# Patient Record
Sex: Female | Born: 1945 | Race: White | Hispanic: No | Marital: Married | Smoking: Never smoker
Health system: Southern US, Academic
[De-identification: ages and names within clinical notes are randomized; demographics above are authoritative.]

## PROBLEM LIST (undated history)

## (undated) DIAGNOSIS — I10 Essential (primary) hypertension: Secondary | ICD-10-CM

## (undated) DIAGNOSIS — E039 Hypothyroidism, unspecified: Secondary | ICD-10-CM

## (undated) DIAGNOSIS — K219 Gastro-esophageal reflux disease without esophagitis: Secondary | ICD-10-CM

---

## 1992-10-06 ENCOUNTER — Other Ambulatory Visit (HOSPITAL_COMMUNITY): Payer: Self-pay | Admitting: EXTERNAL

## 2021-03-13 IMAGING — MR MRI CERVICAL SPINE WITHOUT CONTRAST
4 of 5 series · 24 of 48 positions shown · IV contrast (gadolinium)
Comparison: None available.

﻿EXAM:  [id number]   MRI CERVICAL SPINE WITHOUT CONTRAST
INDICATION: Cervicalgia.
TECHNIQUE: Non-contrast multiplanar, multi sequence MRI of the cervical spine was performed without gadolinium contrast.

[Series 5: T2 · sagittal · 3.0mm · 0.75mm/px · 8 of 15 slices shown (1 of 2)]
[im 1/15]
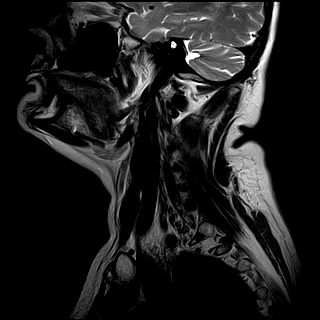
[im 3/15]
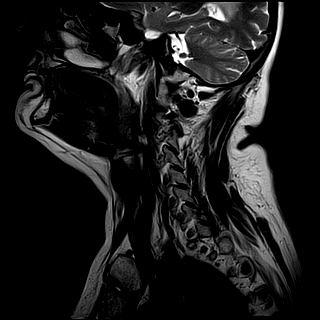
[im 5/15]
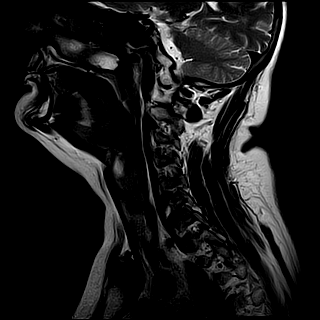
[im 7/15]
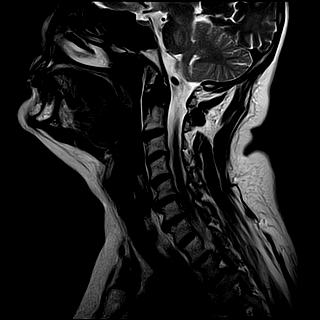
[im 9/15]
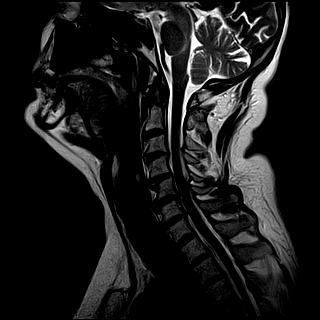
[im 11/15]
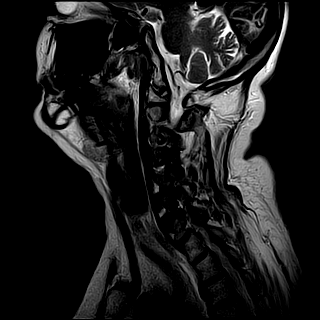
[im 13/15]
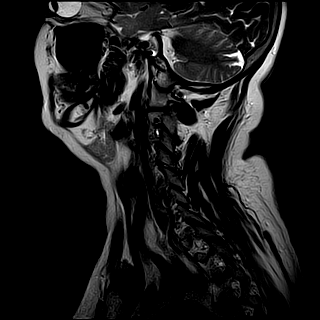
[im 15/15]
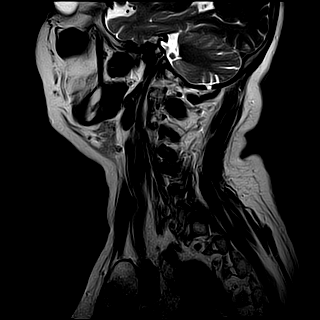

[Series 6: T1 · sagittal · 3.0mm · 0.47mm/px · 3 of 15 slices shown]
[im 2/15]
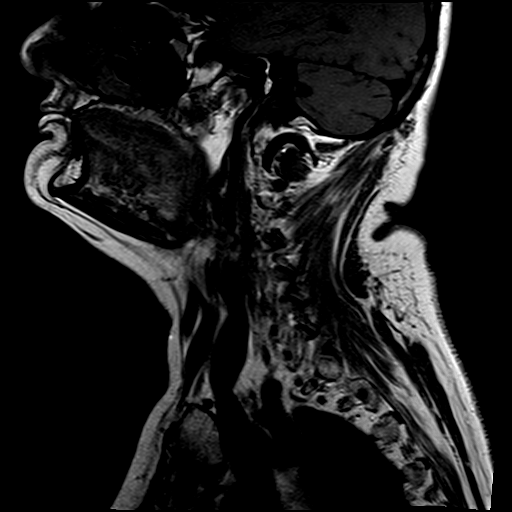
[im 8/15]
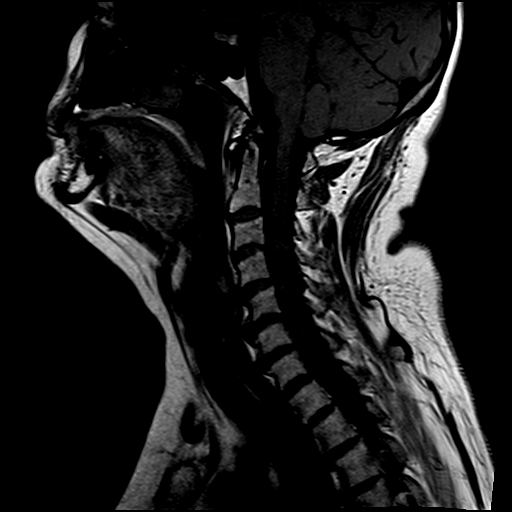
[im 13/15]
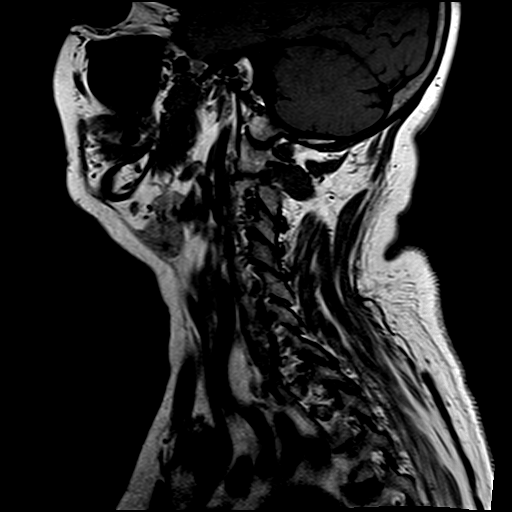

[Series 7: STIR · sagittal · 3.0mm · 0.47mm/px · 3 of 15 slices shown]
[im 2/15]
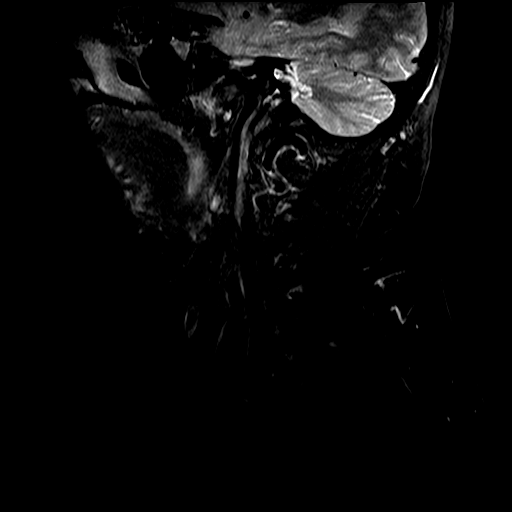
[im 8/15]
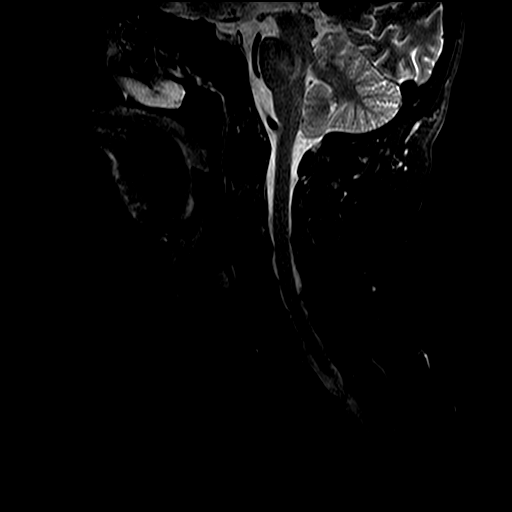
[im 13/15]
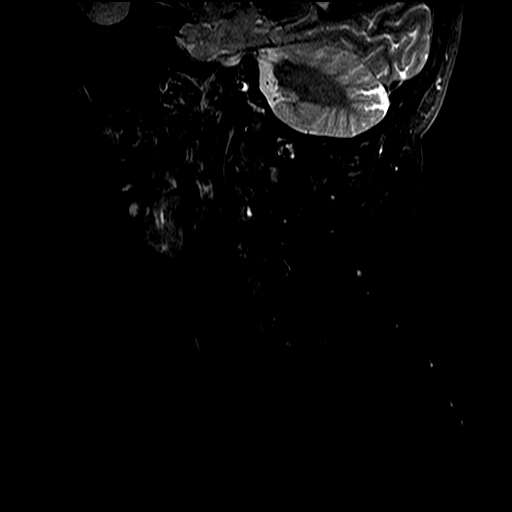

[Series 8: T2 · axial · 3.0mm · 0.39mm/px · z∈[-134,-21]mm · 10 of 18 slices shown (2 of 2)]
[im 1/18]
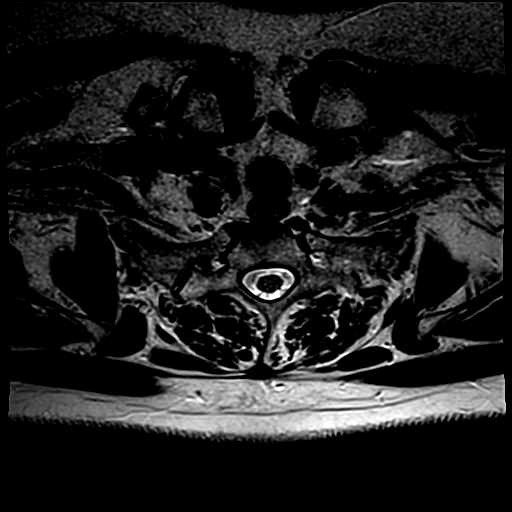
[im 2/18]
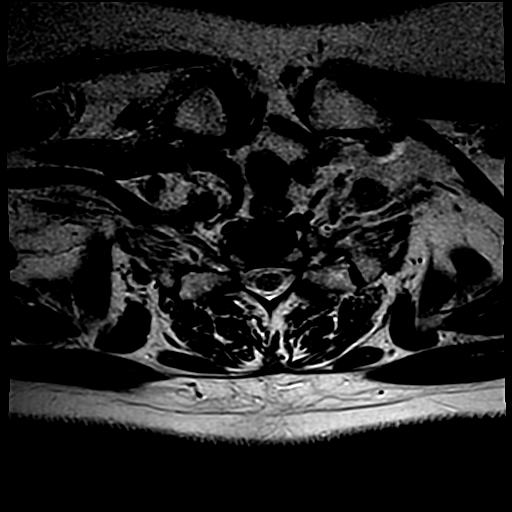
[im 4/18]
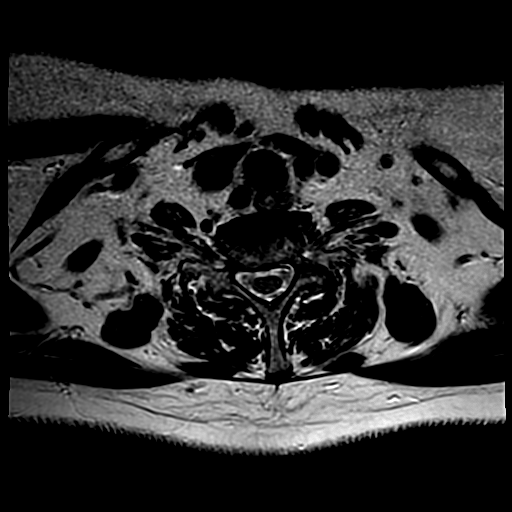
[im 6/18]
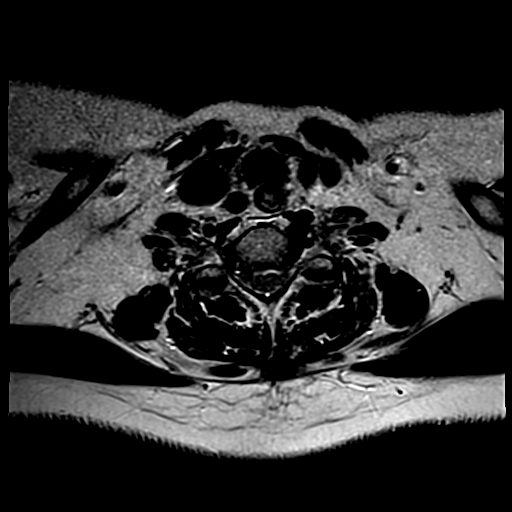
[im 7/18]
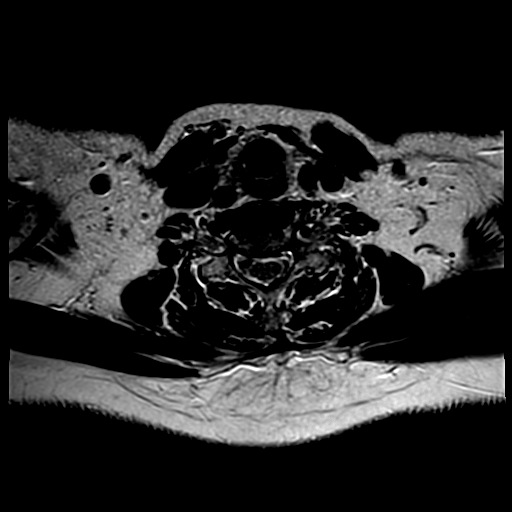
[im 9/18]
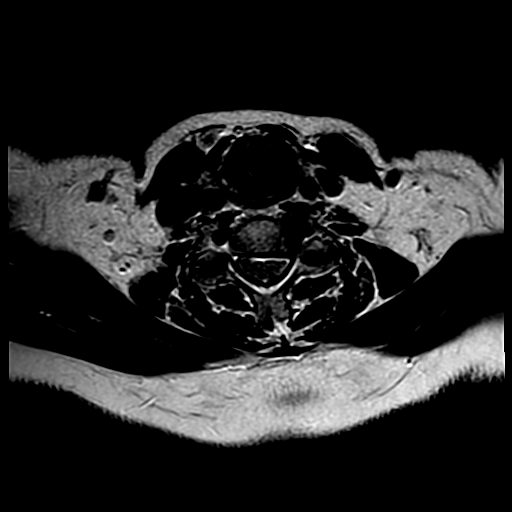
[im 11/18]
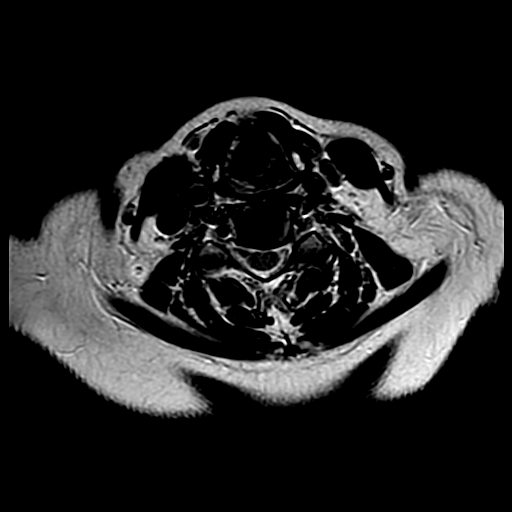
[im 12/18]
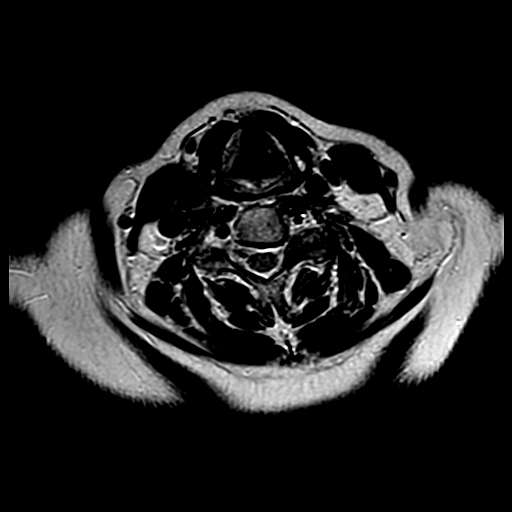
[im 14/18]
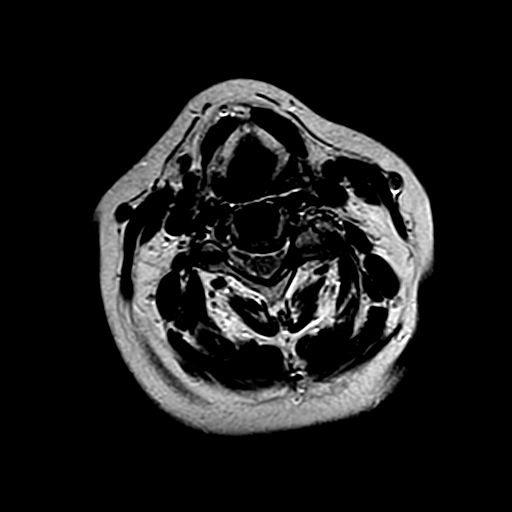
[im 16/18]
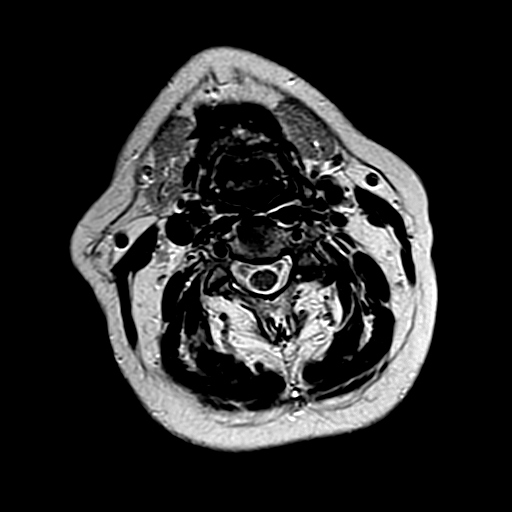

[24 of 48 positions shown; findings below may reference images not displayed]

FINDINGS: There are moderate degenerative changes at multiple levels.  There are small disc-osteophyte complexes at multiple levels with slight compression of the thecal sac and mild spinal stenosis.  

There is no fracture, malalignment, significant marrow signal alteration, disc herniation, intrinsic spinal cord abnormality, or Chiari malformation.
IMPRESSION: 1. Moderate degenerative changes with mild spinal stenosis.  

2. No fracture or disc herniation seen.  

:

## 2021-05-31 IMAGING — MR MRI LUMBAR SPINE WITHOUT AND WITH CONTRAST
9 series · 48 of 48 positions shown · IV contrast (gadavist)
Comparison: MRI lumbar spine dated 07/01/2020.

﻿EXAM:  [id number]   MRI LUMBAR SPINE WITHOUT AND WITH CONTRAST
INDICATION: Chronic lower back pain with left lower extremity radiculopathy. History of remote back surgery.
TECHNIQUE: Multiplanar multisequential MRI of the lumbar spine was performed without and with 5 mL of Gadavist.

[Series 8: T2 · sagittal · 4.0mm · 0.94mm/px · 5 of 13 slices shown (1 of 3)]
[im 1/13]
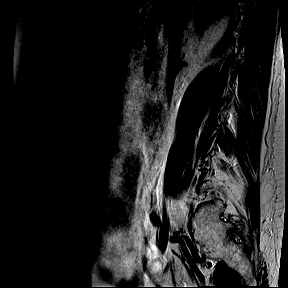
[im 4/13]
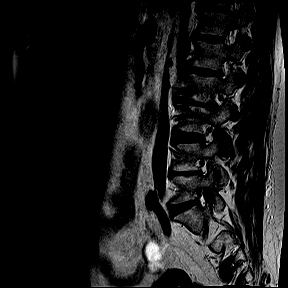
[im 7/13]
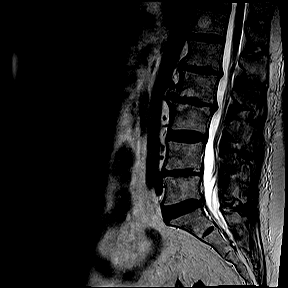
[im 10/13]
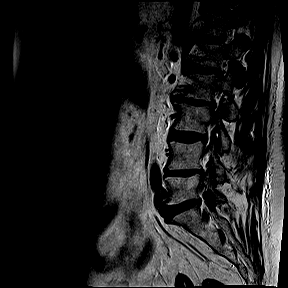
[im 13/13]
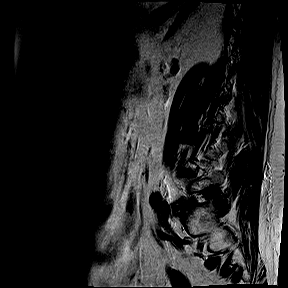

[Series 9: T1 · sagittal · 4.0mm · 0.94mm/px · 4 of 13 slices shown]
[im 1/13]
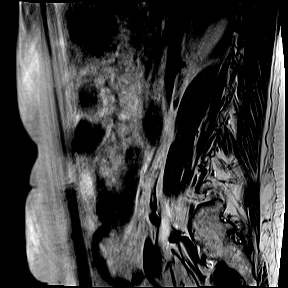
[im 5/13]
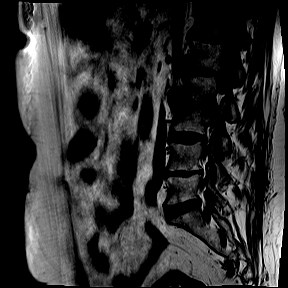
[im 9/13]
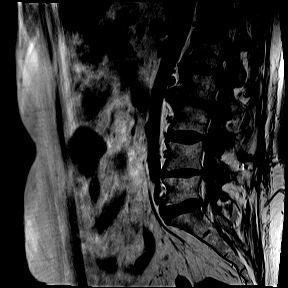
[im 13/13]
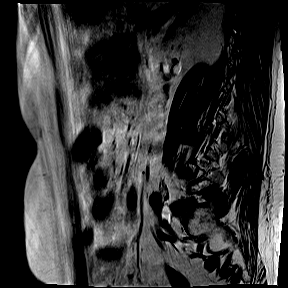

[Series 10: T1 fat-sat · sagittal · 4.0mm · 1.05mm/px · 4 of 13 slices shown (1 of 2)]
[im 1/13]
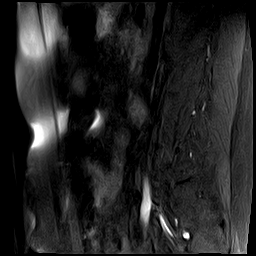
[im 5/13]
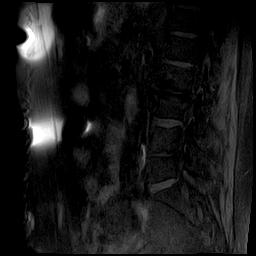
[im 9/13]
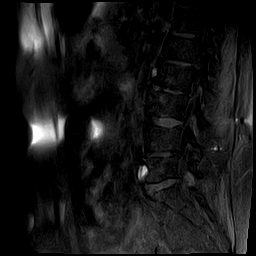
[im 13/13]
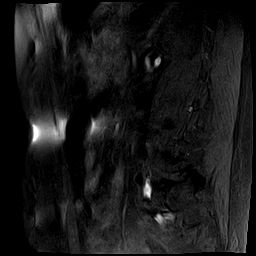

[Series 11: STIR · sagittal · 4.0mm · 1.05mm/px · 4 of 13 slices shown]
[im 1/13]
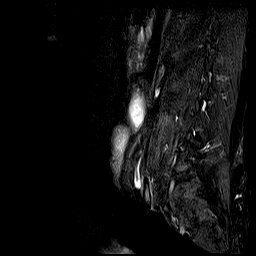
[im 5/13]
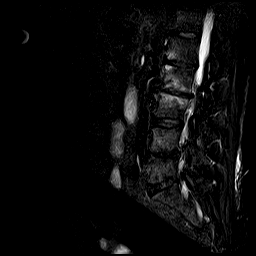
[im 9/13]
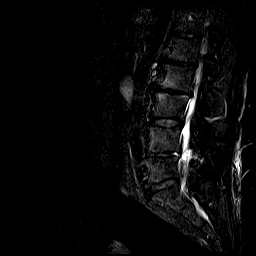
[im 13/13]
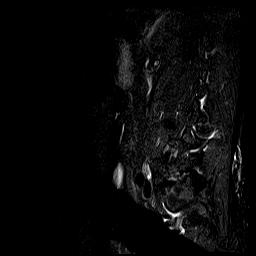

[Series 12: T2 · axial · 4.0mm · 0.47mm/px · z∈[-88,+81]mm · 7 of 23 slices shown (2 of 3)]
[im 1/23]
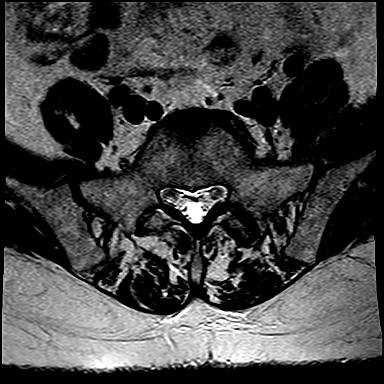
[im 4/23]
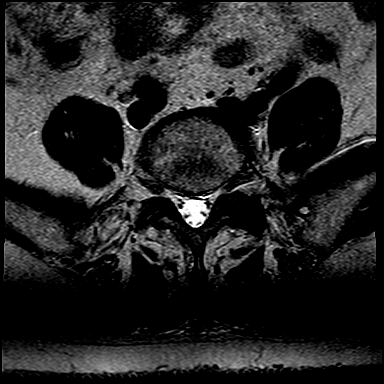
[im 8/23]
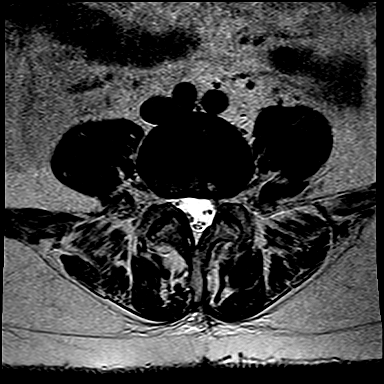
[im 12/23]
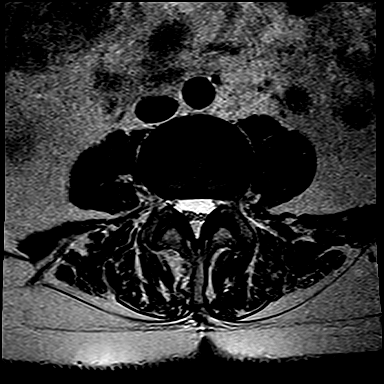
[im 15/23]
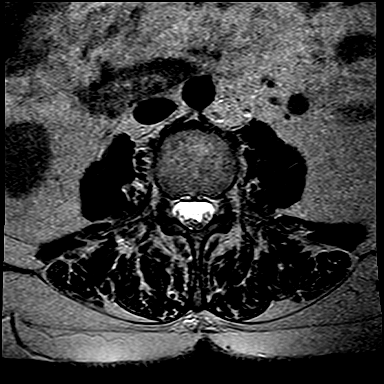
[im 19/23]
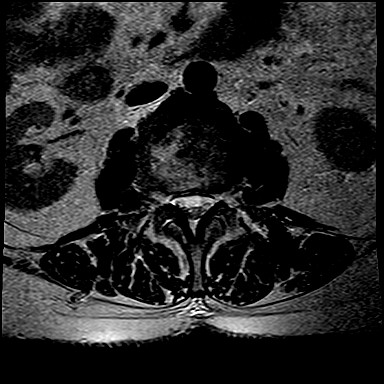
[im 23/23]
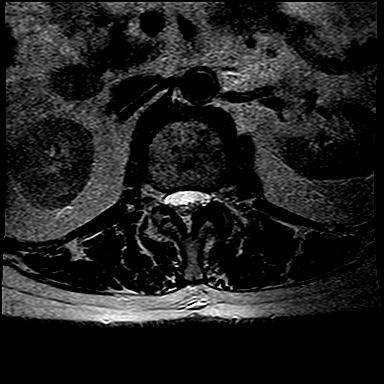

[Series 13: T1 fat-sat · axial · 4.0mm · 0.70mm/px · z∈[-88,+81]mm · 7 of 23 slices shown (2 of 2)]
[im 1/23]
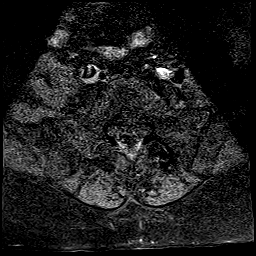
[im 4/23]
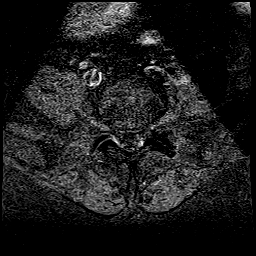
[im 8/23]
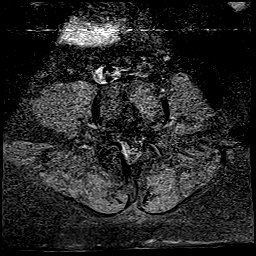
[im 12/23]
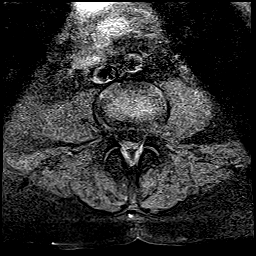
[im 15/23]
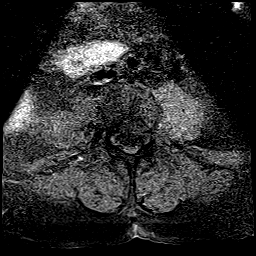
[im 19/23]
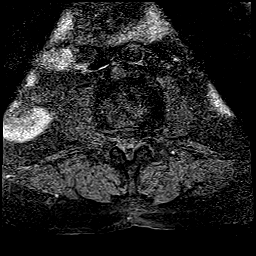
[im 23/23]
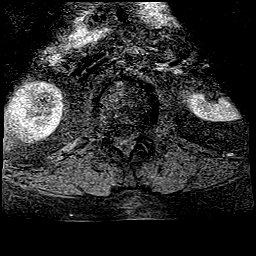

[Series 14: T1 fat-sat post-contrast · sagittal · 4.0mm · 1.05mm/px · 4 of 13 slices shown (1 of 2)]
[im 1/13]
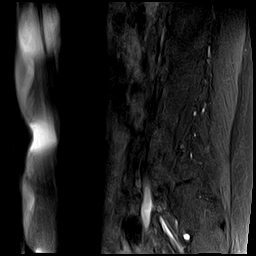
[im 5/13]
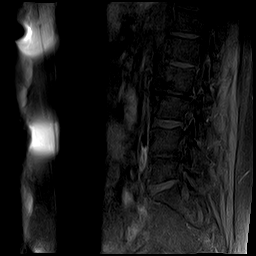
[im 9/13]
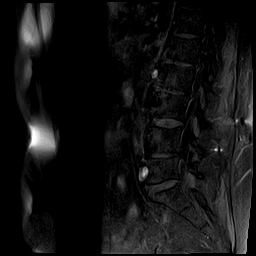
[im 13/13]
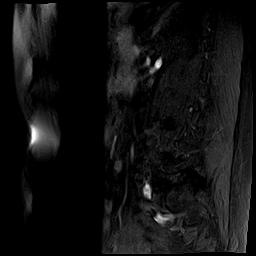

[Series 15: T1 fat-sat post-contrast · axial · 4.0mm · 0.70mm/px · z∈[-88,+81]mm · 7 of 23 slices shown (2 of 2)]
[im 1/23]
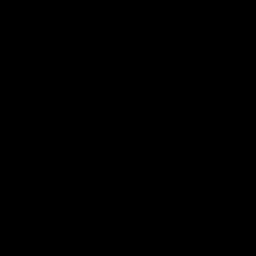
[im 4/23]
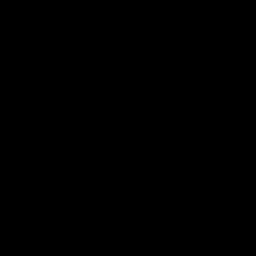
[im 8/23]
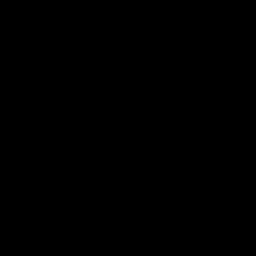
[im 12/23]
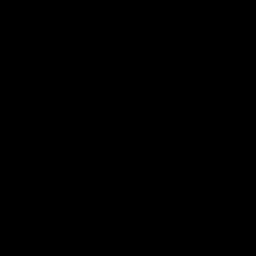
[im 15/23]
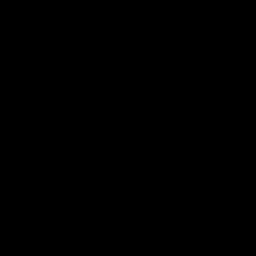
[im 19/23]
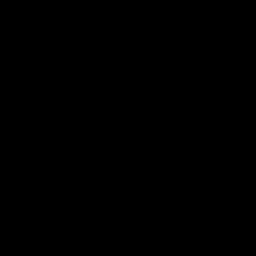
[im 23/23]
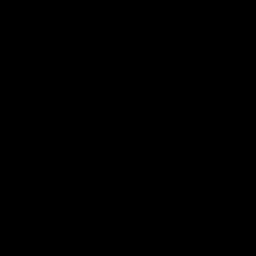

[Series 16: T2 · coronal · 4.0mm · 1.15mm/px · 6 of 20 slices shown (3 of 3)]
[im 1/20]
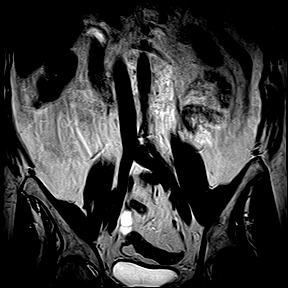
[im 4/20]
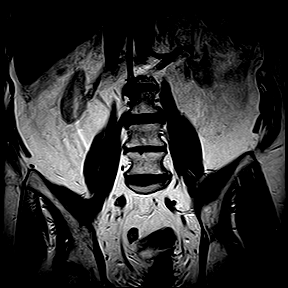
[im 8/20]
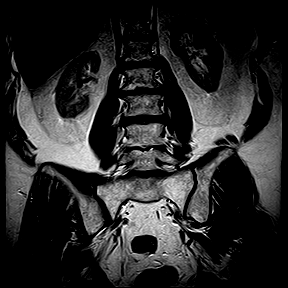
[im 12/20]
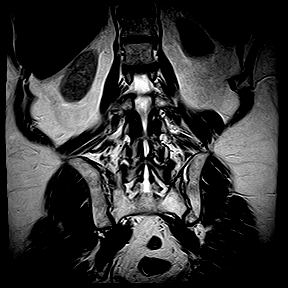
[im 16/20]
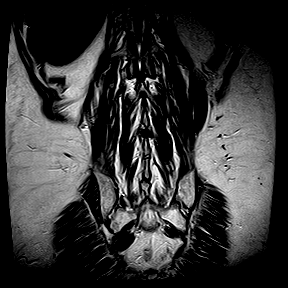
[im 20/20]
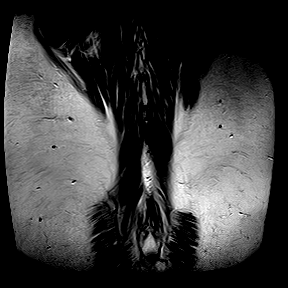

[48 of 48 positions shown; findings below may reference images not displayed]

FINDINGS: Vertebral bodies are normal in height, alignment and signal intensity. There is no acute fracture or subluxation. Distal spinal cord is normal in signal intensity and terminates normally at T12-L1 disc space level. Spinal canal is congenitally narrow.

At L1-2 level, there is moderate to marked disc desiccation. There is a small broad-based central disc bulge, mildly effacing the ventral thecal sac. There is no significant neural foraminal stenosis.

At L2-3 level, there is marked disc desiccation. Modic type 2 changes are also noted within the inferior endplate of L2 and superior endplate of L3 vertebral body. There is a small broad-based central disc bulge resulting in moderate spinal stenosis. There is mild bilateral neural foraminal stenosis from bulging annulus without nerve root impingement.

At L3-4 level, there is a minimal bulging annulus, minimally effacing the ventral thecal sac. There is mild-to-moderate left and mild right neural foraminal stenosis from facet arthropathy and bulging annulus.

At L4-5 level, there is marked disc desiccation. There is no significant disc herniation. There is mild-to-moderate bilateral neural foraminal stenosis from facet arthropathy and bulging annulus.

At L5-S1 level, there is a small broad-based central disc bulge, minimally abutting the ventral thecal sac. There is mild-to-moderate bilateral neural foraminal stenosis from facet arthropathy and bulging annulus.

Paraspinal soft tissues are unremarkable. Following intravenous contrast administration, there is no abnormal nerve root or paraspinal soft tissue enhancement.
IMPRESSION: 1. Stable moderate spinal stenosis at L2-3 level from a small central disc bulge. 

2. Multilevel neural foraminal stenosis as detailed above.

## 2022-05-31 ENCOUNTER — Inpatient Hospital Stay
Admission: RE | Admit: 2022-05-31 | Discharge: 2022-05-31 | Disposition: A | Discharge status: Home / Self Care | Payer: Medicare PPO | Source: Ambulatory Visit | Attending: Family | Admitting: Family

## 2022-05-31 ENCOUNTER — Other Ambulatory Visit: Payer: Self-pay

## 2022-05-31 ENCOUNTER — Other Ambulatory Visit (HOSPITAL_COMMUNITY): Payer: Self-pay | Admitting: Family

## 2022-05-31 DIAGNOSIS — R0602 Shortness of breath: Secondary | ICD-10-CM

## 2022-06-25 ENCOUNTER — Other Ambulatory Visit (HOSPITAL_COMMUNITY): Payer: Self-pay | Admitting: Family

## 2022-06-25 DIAGNOSIS — R0600 Dyspnea, unspecified: Secondary | ICD-10-CM

## 2022-07-09 ENCOUNTER — Other Ambulatory Visit: Payer: Self-pay

## 2022-07-09 ENCOUNTER — Inpatient Hospital Stay
Admission: RE | Admit: 2022-07-09 | Discharge: 2022-07-09 | Disposition: A | Discharge status: Home / Self Care | Payer: Medicare PPO | Source: Ambulatory Visit | Attending: Family | Admitting: Family

## 2022-07-09 DIAGNOSIS — R0600 Dyspnea, unspecified: Secondary | ICD-10-CM | POA: Insufficient documentation

## 2022-07-09 MED ORDER — SODIUM CHLORIDE 0.9 % INJECTION SOLUTION
2.0000 mL | Freq: Once | INTRAVENOUS | Status: DC | PRN
Start: 2022-07-09 — End: 2022-07-10
  Administered 2022-07-09: 5 mL via INTRAVENOUS

## 2022-07-12 ENCOUNTER — Emergency Department (HOSPITAL_COMMUNITY): Payer: Medicare PPO

## 2022-07-12 ENCOUNTER — Encounter (HOSPITAL_COMMUNITY): Payer: Self-pay

## 2022-07-12 ENCOUNTER — Other Ambulatory Visit: Payer: Self-pay

## 2022-07-12 ENCOUNTER — Emergency Department
Admission: EM | Admit: 2022-07-12 | Discharge: 2022-07-13 | Disposition: A | Discharge status: Home / Self Care | Payer: Medicare PPO | Attending: Emergency Medicine | Admitting: Emergency Medicine

## 2022-07-12 DIAGNOSIS — R9431 Abnormal electrocardiogram [ECG] [EKG]: Secondary | ICD-10-CM

## 2022-07-12 DIAGNOSIS — R55 Syncope and collapse: Secondary | ICD-10-CM

## 2022-07-12 DIAGNOSIS — K529 Noninfective gastroenteritis and colitis, unspecified: Secondary | ICD-10-CM | POA: Insufficient documentation

## 2022-07-12 DIAGNOSIS — N390 Urinary tract infection, site not specified: Secondary | ICD-10-CM

## 2022-07-12 DIAGNOSIS — I452 Bifascicular block: Secondary | ICD-10-CM | POA: Insufficient documentation

## 2022-07-12 DIAGNOSIS — I517 Cardiomegaly: Secondary | ICD-10-CM | POA: Insufficient documentation

## 2022-07-12 HISTORY — DX: Essential (primary) hypertension: I10

## 2022-07-12 HISTORY — DX: Hypothyroidism, unspecified: E03.9

## 2022-07-12 HISTORY — DX: Gastro-esophageal reflux disease without esophagitis: K21.9

## 2022-07-12 LAB — CBC WITH DIFF
BASOPHIL #: 0.2 10*3/uL (ref 0.00–0.30)
BASOPHIL %: 2 % (ref 0–3)
EOSINOPHIL #: 0.2 10*3/uL (ref 0.00–0.80)
EOSINOPHIL %: 3 % (ref 0–7)
HCT: 40.1 % (ref 37.0–47.0)
HGB: 14 g/dL (ref 12.5–16.0)
LYMPHOCYTE #: 2 10*3/uL (ref 1.10–5.00)
LYMPHOCYTE %: 25 % (ref 25–45)
MCH: 33.8 pg — ABNORMAL HIGH (ref 27.0–32.0)
MCHC: 34.9 g/dL (ref 32.0–36.0)
MCV: 96.9 fL (ref 78.0–99.0)
MONOCYTE #: 0.7 10*3/uL (ref 0.00–1.30)
MONOCYTE %: 8 % (ref 0–12)
MPV: 7.4 fL (ref 7.4–10.4)
NEUTROPHIL #: 4.9 10*3/uL (ref 1.80–8.40)
NEUTROPHIL %: 61 % (ref 40–76)
PLATELETS: 161 10*3/uL (ref 140–440)
RBC: 4.14 10*6/uL — ABNORMAL LOW (ref 4.20–5.40)
RDW: 12.6 % (ref 11.6–14.8)
WBC: 8 10*3/uL (ref 4.0–10.5)
WBCS UNCORRECTED: 8 10*3/uL

## 2022-07-12 LAB — COMPREHENSIVE METABOLIC PANEL, NON-FASTING
ALBUMIN/GLOBULIN RATIO: 1.4 (ref 0.8–1.4)
ALBUMIN: 4.6 g/dL (ref 3.5–5.7)
ALKALINE PHOSPHATASE: 60 U/L (ref 34–104)
ALT (SGPT): 22 U/L (ref 7–52)
ANION GAP: 11 mmol/L (ref 10–20)
AST (SGOT): 26 U/L (ref 13–39)
BILIRUBIN TOTAL: 0.9 mg/dL (ref 0.3–1.2)
BUN/CREA RATIO: 19 (ref 6–22)
BUN: 25 mg/dL (ref 7–25)
CALCIUM, CORRECTED: 9.6 mg/dL (ref 8.9–10.8)
CALCIUM: 10.2 mg/dL (ref 8.6–10.3)
CHLORIDE: 102 mmol/L (ref 98–107)
CO2 TOTAL: 25 mmol/L (ref 21–31)
CREATININE: 1.31 mg/dL — ABNORMAL HIGH (ref 0.60–1.30)
ESTIMATED GFR: 42 mL/min/{1.73_m2} — ABNORMAL LOW (ref >59)
GLOBULIN: 3.2 (ref 2.9–5.4)
GLUCOSE: 143 mg/dL — ABNORMAL HIGH (ref 74–109)
OSMOLALITY, CALCULATED: 283 mOsm/kg (ref 270–290)
POTASSIUM: 4.5 mmol/L (ref 3.5–5.1)
PROTEIN TOTAL: 7.8 g/dL (ref 6.4–8.9)
SODIUM: 138 mmol/L (ref 136–145)

## 2022-07-12 LAB — URINALYSIS, MACROSCOPIC
BILIRUBIN: NEGATIVE mg/dL
BLOOD: NEGATIVE mg/dL
GLUCOSE: NEGATIVE mg/dL
KETONES: NEGATIVE mg/dL
LEUKOCYTES: 250 WBCs/uL — AB
NITRITE: NEGATIVE
PH: 5.5 (ref 5.0–9.0)
PROTEIN: NEGATIVE mg/dL
SPECIFIC GRAVITY: 1.015 (ref 1.002–1.030)
UROBILINOGEN: 2 mg/dL — AB

## 2022-07-12 LAB — URINALYSIS, MICROSCOPIC
BACTERIA: NEGATIVE /hpf
HYALINE CASTS: 15 /lpf — ABNORMAL HIGH (ref <0)
RBCS: 1 /hpf (ref <4)
SQUAMOUS EPITHELIAL: 1 /hpf (ref <28)
WBCS: 25 /hpf — ABNORMAL HIGH (ref <6)

## 2022-07-12 LAB — LIPASE: LIPASE: 19 U/L (ref 11–82)

## 2022-07-12 MED ORDER — ONDANSETRON HCL (PF) 4 MG/2 ML INJECTION SOLUTION
INTRAMUSCULAR | Status: AC
Start: 2022-07-12 — End: 2022-07-12
  Filled 2022-07-12: qty 2

## 2022-07-12 MED ORDER — MORPHINE 2 MG/ML INJECTION WRAPPER
2.0000 mg | INJECTION | INTRAMUSCULAR | Status: AC
Start: 2022-07-12 — End: 2022-07-12
  Administered 2022-07-12: 2 mg via INTRAVENOUS

## 2022-07-12 MED ORDER — SODIUM CHLORIDE 0.9 % IV BOLUS
1000.0000 mL | INJECTION | Status: AC
Start: 2022-07-12 — End: 2022-07-12
  Administered 2022-07-12: 0 mL via INTRAVENOUS
  Administered 2022-07-12: 1000 mL via INTRAVENOUS

## 2022-07-12 MED ORDER — MORPHINE 2 MG/ML INJECTION WRAPPER
INJECTION | INTRAMUSCULAR | Status: AC
Start: 2022-07-12 — End: 2022-07-12
  Filled 2022-07-12: qty 1

## 2022-07-12 MED ORDER — LEVOFLOXACIN 500 MG/100 ML IN 5 % DEXTROSE INTRAVENOUS PIGGYBACK
500.0000 mg | INJECTION | INTRAVENOUS | Status: AC
Start: 2022-07-12 — End: 2022-07-13
  Administered 2022-07-12: 500 mg via INTRAVENOUS
  Administered 2022-07-13: 0 mg via INTRAVENOUS

## 2022-07-12 MED ORDER — DICYCLOMINE 10 MG CAPSULE
ORAL_CAPSULE | ORAL | Status: AC
Start: 2022-07-12 — End: 2022-07-12
  Filled 2022-07-12: qty 1

## 2022-07-12 MED ORDER — LEVOFLOXACIN 500 MG TABLET
500.0000 mg | ORAL_TABLET | Freq: Every day | ORAL | 0 refills | Status: AC
Start: 2022-07-12 — End: 2022-07-17

## 2022-07-12 MED ORDER — ONDANSETRON HCL (PF) 4 MG/2 ML INJECTION SOLUTION
4.0000 mg | INTRAMUSCULAR | Status: AC
Start: 2022-07-12 — End: 2022-07-12
  Administered 2022-07-12: 4 mg via INTRAVENOUS

## 2022-07-12 MED ORDER — LEVOFLOXACIN 500 MG/100 ML IN 5 % DEXTROSE INTRAVENOUS PIGGYBACK
INJECTION | INTRAVENOUS | Status: AC
Start: 2022-07-12 — End: 2022-07-12
  Filled 2022-07-12: qty 100

## 2022-07-12 MED ORDER — DICYCLOMINE 10 MG CAPSULE
10.0000 mg | ORAL_CAPSULE | ORAL | Status: AC
Start: 2022-07-12 — End: 2022-07-12
  Administered 2022-07-12: 10 mg via ORAL

## 2022-07-12 MED ORDER — DICYCLOMINE 20 MG TABLET
20.0000 mg | ORAL_TABLET | Freq: Four times a day (QID) | ORAL | 0 refills | Status: AC | PRN
Start: 2022-07-12 — End: 2022-07-19

## 2022-07-12 MED ORDER — METRONIDAZOLE 500 MG TABLET
500.0000 mg | ORAL_TABLET | Freq: Three times a day (TID) | ORAL | 0 refills | Status: AC
Start: 2022-07-12 — End: 2022-07-17

## 2022-07-12 MED ORDER — METRONIDAZOLE 500 MG/100 ML IN SODIUM CHLOR(ISO) INTRAVENOUS PIGGYBACK
INJECTION | INTRAVENOUS | Status: AC
Start: 2022-07-12 — End: 2022-07-12
  Filled 2022-07-12: qty 100

## 2022-07-12 MED ORDER — ONDANSETRON 4 MG DISINTEGRATING TABLET
4.0000 mg | ORAL_TABLET | Freq: Three times a day (TID) | ORAL | 0 refills | Status: AC | PRN
Start: 2022-07-12 — End: 2022-07-27

## 2022-07-12 MED ORDER — METRONIDAZOLE 500 MG/100 ML IN SODIUM CHLOR(ISO) INTRAVENOUS PIGGYBACK
500.0000 mg | INJECTION | INTRAVENOUS | Status: AC
Start: 2022-07-12 — End: 2022-07-12
  Administered 2022-07-12: 0 mg via INTRAVENOUS
  Administered 2022-07-12: 500 mg via INTRAVENOUS

## 2022-07-12 NOTE — ED Provider Notes (Signed)
Emergency Medicine    Name: Peggy Dorsey  Age and Gender: 77 y.o. female  Date of Birth: 1945/06/13  MRN: [id number]  PCP: Yancey Flemings, FNP-BC    CC:  Chief Complaint   Patient presents with   . Syncope       HPI:  Peggy Dorsey is a 78 y.o. White female with history of syncope. She was playing the video  Lottery for an hour when she developed intense abdominal pain and an urge to defecate. She had no vomiting, no fever, no chest pain or dyspnea. She lay down on the floor and is uncertain if she had syncope or not.    Hx of cholecystectomy.      [place] Pain Rating Scale     On a scale of 0-10, during the past 24 hours, pain has interfered with you usual activity:       On a scale of 0-10, during the past 24 hours, pain has interfered with your sleep:      On a scale of 0-10, during the past 24 hours, pain has affected your mood:       On a scale of 0-10, during the past 24 hours, pain has contributed to your stress:       On a scale of 0-10, what is your overall pain Rating: 10        Below pertinent information reviewed with patient:  Past Medical History:   Diagnosis Date   . GERD (gastroesophageal reflux disease)    . HTN (hypertension)    . Hypothyroidism            No Known Allergies      Social History        Objective:    ED Triage Vitals [07/12/22 1843]   BP (Non-Invasive) (!) 145/50   Heart Rate 76   Respiratory Rate 18   Temperature 36.6 C (97.8 F)   SpO2 95 %   Weight 97.5 kg (215 lb)   Height 1.707 m (5' 7.2")     Filed Vitals:    07/12/22 1843 07/12/22 1900 07/12/22 2000 07/12/22 2045   BP: (!) 145/50 (!) 160/81 (!) 161/78 (!) 173/81   Pulse: 76  67 76   Resp: 18  19 (!) 11   Temp: 36.6 C (97.8 F)      SpO2: 95%  98% 94%       Nursing notes and vital signs reviewed.    Constitutional - No acute distress.  Holding her abdomen, appears uncomfortable.  HEENT - Normocephalic. Atraumatic. PERRL. EOMI. Conjunctiva clear. Oropharynx with no erythema, lesions, or exudates. Moist mucous membranes.   Neck -  Trachea midline. No stridor. No hoarseness.  Cardiac - Regular rate and rhythm. No murmurs, rubs, or gallops.  Respiratory - Clear to auscultation bilaterally. No rales, wheezes or rhonchi.  Abdomen - Soft, mild mid abdominal tenderness. No guarding or rigidity.    Musculoskeletal - Good AROM. No muscle or joint tenderness appreciated. No clubbing, cyanosis or edema.  Skin - Warm and dry, without any rashes or other lesions.  Neuro - Cranial nerves II-XII are grossly intact.  Moving all extremities symmetrically.    Any pertinent labs and imaging obtained during this encounter reviewed below in MDM.    MDM/ED Course:    EKG shows a sinus rhythm, RBBB, LaFB. LVH. No STEMI.      Patient is feeling better. Her CT abdomen/pelvis shows colitis.  She also has mild UTI.  She prefers to try going home.  She will be advised to take a clear liquid diet for one day, then full liquids then slowly advance.    She is discharged on Zofran, bentyl, Levaquin and flagyl.    She has received, morphine, Zofran, Levaquin and Flagyl IV and NS x 1 liter.    Medical Decision Making  Amount and/or Complexity of Data Reviewed  Labs: ordered. Decision-making details documented in ED Course.  Radiology: ordered. Decision-making details documented in ED Course.  ECG/medicine tests: ordered. Decision-making details documented in ED Course.      Risk  Prescription drug management.  Decision regarding hospitalization.               Orders Placed This Encounter   . URINE CULTURE,ROUTINE   . ADULT ROUTINE BLOOD CULTURE, SET OF 2 ADULT BOTTLES (BACTERIA AND YEAST)   . ADULT ROUTINE BLOOD CULTURE, SET OF 2 ADULT BOTTLES (BACTERIA AND YEAST)   . CT ABDOMEN PELVIS WO IV CONTRAST   . CBC/DIFF   . COMPREHENSIVE METABOLIC PANEL, NON-FASTING   . LIPASE   . URINALYSIS, MACROSCOPIC AND MICROSCOPIC W/CULTURE REFLEX [PRN ONLY]   . CBC WITH DIFF   . URINALYSIS, MACROSCOPIC   . URINALYSIS, MICROSCOPIC   . CANCELED: URINALYSIS, MACROSCOPIC AND MICROSCOPIC  W/CULTURE REFLEX [PRN ONLY]   . CANCELED: URINALYSIS, MACROSCOPIC   . CANCELED: URINALYSIS, MICROSCOPIC   . ECG 12 LEAD   . NS bolus infusion 1,000 mL   . morphine 2 mg/mL injection   . ondansetron (ZOFRAN) 2 mg/mL injection   . dicyclomine (BENTYL) capsule   . levoFLOXacin (LEVAQUIN) 500 mg in D5W 100 mL premix IVPB   . metroNIDAZOLE (FLAGYL) 500 mg in NS 100 mL premix IVPB   . levoFLOXacin (LEVAQUIN) 500 mg Oral Tablet   . metroNIDAZOLE (FLAGYL) 500 mg Oral Tablet   . dicyclomine (BENTYL) 20 mg Oral Tablet   . ondansetron (ZOFRAN ODT) 4 mg Oral Tablet, Rapid Dissolve       There was no indication for administration of aspirin in this patient today.    Any procedures:  Procedures    Impression:   Clinical Impression   Near syncope   Colitis (Primary)   UTI (urinary tract infection)       Disposition: Discharged          Portions of this note may have been dictated using voice recognition software.     Cain Saupe, MD  [facility] ED    -----------------------  Results for orders placed or performed during the hospital encounter of 07/12/22 (from the past 12 hour(s))   COMPREHENSIVE METABOLIC PANEL, NON-FASTING   Result Value Ref Range    SODIUM 138 136 - 145 mmol/L    POTASSIUM 4.5 3.5 - 5.1 mmol/L    CHLORIDE 102 98 - 107 mmol/L    CO2 TOTAL 25 21 - 31 mmol/L    ANION GAP 11 10 - 20 mmol/L    BUN 25 7 - 25 mg/dL    CREATININE 5.02 (H) 0.60 - 1.30 mg/dL    BUN/CREA RATIO 19 6 - 22    ESTIMATED GFR 42 (L) >59 mL/min/1.83m^2    ALBUMIN 4.6 3.5 - 5.7 g/dL    CALCIUM 77.4 8.6 - 12.8 mg/dL    GLUCOSE 786 (H) 74 - 109 mg/dL    ALKALINE PHOSPHATASE 60 34 - 104 U/L    ALT (SGPT) 22 7 - 52 U/L    AST (SGOT) 26 13 -  39 U/L    BILIRUBIN TOTAL 0.9 0.3 - 1.2 mg/dL    PROTEIN TOTAL 7.8 6.4 - 8.9 g/dL    ALBUMIN/GLOBULIN RATIO 1.4 0.8 - 1.4    OSMOLALITY, CALCULATED 283 270 - 290 mOsm/kg    CALCIUM, CORRECTED 9.6 8.9 - 10.8 mg/dL    GLOBULIN 3.2 2.9 - 5.4   LIPASE   Result Value Ref Range    LIPASE 19 11 - 82 U/L    CBC WITH DIFF   Result Value Ref Range    WBCS UNCORRECTED 8.0 x10^3/uL    WBC 8.0 4.0 - 10.5 x10^3/uL    RBC 4.14 (L) 4.20 - 5.40 x10^6/uL    HGB 14.0 12.5 - 16.0 g/dL    HCT 85.6 31.4 - 97.0 %    MCV 96.9 78.0 - 99.0 fL    MCH 33.8 (H) 27.0 - 32.0 pg    MCHC 34.9 32.0 - 36.0 g/dL    RDW 26.3 78.5 - 88.5 %    PLATELETS 161 140 - 440 x10^3/uL    MPV 7.4 7.4 - 10.4 fL    NEUTROPHIL % 61 40 - 76 %    LYMPHOCYTE % 25 25 - 45 %    MONOCYTE % 8 0 - 12 %    EOSINOPHIL % 3 0 - 7 %    BASOPHIL % 2 0 - 3 %    NEUTROPHIL # 4.90 1.80 - 8.40 x10^3/uL    LYMPHOCYTE # 2.00 1.10 - 5.00 x10^3/uL    MONOCYTE # 0.70 0.00 - 1.30 x10^3/uL    EOSINOPHIL # 0.20 0.00 - 0.80 x10^3/uL    BASOPHIL # 0.20 0.00 - 0.30 x10^3/uL   URINALYSIS, MACROSCOPIC   Result Value Ref Range    COLOR Yellow Colorless, Light Yellow, Yellow    APPEARANCE Clear Clear    SPECIFIC GRAVITY 1.015 1.002 - 1.030    PH 5.5 5.0 - 9.0    LEUKOCYTES 250 (A) Negative, 100  WBCs/uL    NITRITE Negative Negative    PROTEIN Negative Negative, 10 , 20  mg/dL    GLUCOSE Negative Negative, 30  mg/dL    KETONES Negative Negative, Trace mg/dL    BILIRUBIN Negative Negative, 0.5 mg/dL    BLOOD Negative Negative, 0.03 mg/dL    UROBILINOGEN 2 (A) Normal mg/dL   URINALYSIS, MICROSCOPIC   Result Value Ref Range    BACTERIA Negative Negative /hpf    MUCOUS Rare (A) (none) /hpf    RBCS 1 <4 /hpf    WBCS 25 (H) <6 /hpf    HYALINE CASTS 15 (H) <0 /lpf    SQUAMOUS EPITHELIAL 1 <28 /hpf     CT ABDOMEN PELVIS WO IV CONTRAST   Final Result   Findings are consistent with a colitis involving the left side of the descending colon. There are mild associated inflammatory changes.      There is no bowel obstruction      Moderate fecal content      Moderate fluid and gaseous distention of the stomach          One or Dorsey dose reduction techniques were used (e.g., Automated exposure control, adjustment of the mA and/or kV according to patient size, use of iterative reconstruction technique).          Radiologist location ID: [id number]

## 2022-07-12 NOTE — ED Triage Notes (Signed)
Syncopal episode ems reports syncopal with them also had echo here yesterday . 20 g Zofran 4 acc 136

## 2022-07-12 NOTE — Discharge Instructions (Signed)
Use antibiotic. Take a clear liquid diet for 24 hours, then full liquids for 24 hours, then slowly advance. Use prescriptions. Return anytime for worse pain, fever or vomiting.

## 2022-07-13 LAB — ECG 12 LEAD
Atrial Rate: 83 {beats}/min
Calculated P Axis: 79 degrees
Calculated R Axis: -66 degrees
Calculated T Axis: 85 degrees
PR Interval: 204 ms
QRS Duration: 144 ms
QT Interval: 418 ms
QTC Calculation: 491 ms
Ventricular rate: 83 {beats}/min

## 2022-07-15 LAB — URINE CULTURE,ROUTINE: URINE CULTURE: 10000 — AB

## 2022-07-17 LAB — ADULT ROUTINE BLOOD CULTURE, SET OF 2 BOTTLES (BACTERIA AND YEAST)
BLOOD CULTURE, ROUTINE: NO GROWTH
BLOOD CULTURE, ROUTINE: NO GROWTH

## 2023-03-14 ENCOUNTER — Ambulatory Visit (INDEPENDENT_AMBULATORY_CARE_PROVIDER_SITE_OTHER): Payer: Self-pay | Admitting: Nephrology

## 2023-03-29 ENCOUNTER — Ambulatory Visit (INDEPENDENT_AMBULATORY_CARE_PROVIDER_SITE_OTHER): Payer: Medicare PPO | Admitting: Nephrology

## 2023-04-26 ENCOUNTER — Ambulatory Visit (INDEPENDENT_AMBULATORY_CARE_PROVIDER_SITE_OTHER): Payer: Medicare PPO | Admitting: Nephrology

## 2023-05-17 ENCOUNTER — Ambulatory Visit (INDEPENDENT_AMBULATORY_CARE_PROVIDER_SITE_OTHER): Payer: Medicare PPO | Admitting: Nephrology
